# Patient Record
Sex: Female | Born: 1990 | Race: White | Hispanic: No | Smoking: Current every day smoker
Health system: Southern US, Community
[De-identification: ages and names within clinical notes are randomized; demographics above are authoritative.]

## PROBLEM LIST (undated history)

## (undated) ENCOUNTER — Inpatient Hospital Stay (HOSPITAL_COMMUNITY): Payer: Self-pay

## (undated) DIAGNOSIS — F32A Depression, unspecified: Secondary | ICD-10-CM

## (undated) DIAGNOSIS — B999 Unspecified infectious disease: Secondary | ICD-10-CM

## (undated) DIAGNOSIS — R000 Tachycardia, unspecified: Secondary | ICD-10-CM

## (undated) DIAGNOSIS — O139 Gestational [pregnancy-induced] hypertension without significant proteinuria, unspecified trimester: Secondary | ICD-10-CM

## (undated) DIAGNOSIS — M5136 Other intervertebral disc degeneration, lumbar region: Secondary | ICD-10-CM

## (undated) DIAGNOSIS — I219 Acute myocardial infarction, unspecified: Secondary | ICD-10-CM

## (undated) DIAGNOSIS — M51369 Other intervertebral disc degeneration, lumbar region without mention of lumbar back pain or lower extremity pain: Secondary | ICD-10-CM

## (undated) DIAGNOSIS — F419 Anxiety disorder, unspecified: Secondary | ICD-10-CM

## (undated) DIAGNOSIS — R42 Dizziness and giddiness: Secondary | ICD-10-CM

## (undated) DIAGNOSIS — T8859XA Other complications of anesthesia, initial encounter: Secondary | ICD-10-CM

## (undated) DIAGNOSIS — T4145XA Adverse effect of unspecified anesthetic, initial encounter: Secondary | ICD-10-CM

## (undated) DIAGNOSIS — F329 Major depressive disorder, single episode, unspecified: Secondary | ICD-10-CM

## (undated) HISTORY — PX: TEE WITHOUT CARDIOVERSION: SHX5443

## (undated) HISTORY — PX: MYRINGOTOMY: SUR874

## (undated) HISTORY — DX: Other intervertebral disc degeneration, lumbar region: M51.36

## (undated) HISTORY — PX: LEFT HEART CATH: SHX5946

## (undated) HISTORY — DX: Acute myocardial infarction, unspecified: I21.9

## (undated) HISTORY — DX: Dizziness and giddiness: R42

## (undated) HISTORY — PX: OTHER SURGICAL HISTORY: SHX169

## (undated) HISTORY — DX: Tachycardia, unspecified: R00.0

## (undated) HISTORY — PX: DILATION AND CURETTAGE OF UTERUS: SHX78

## (undated) HISTORY — DX: Other intervertebral disc degeneration, lumbar region without mention of lumbar back pain or lower extremity pain: M51.369

---
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
## 2012-01-05 ENCOUNTER — Other Ambulatory Visit (HOSPITAL_COMMUNITY): Payer: Self-pay | Admitting: Obstetrics and Gynecology

## 2012-01-05 DIAGNOSIS — O1400 Mild to moderate pre-eclampsia, unspecified trimester: Secondary | ICD-10-CM

## 2012-01-06 ENCOUNTER — Encounter (HOSPITAL_COMMUNITY): Payer: Self-pay | Admitting: *Deleted

## 2012-01-06 ENCOUNTER — Ambulatory Visit (HOSPITAL_COMMUNITY): Admission: RE | Admit: 2012-01-06 | Payer: Medicaid Other | Source: Ambulatory Visit

## 2012-01-06 ENCOUNTER — Inpatient Hospital Stay (HOSPITAL_COMMUNITY)
Admission: AD | Admit: 2012-01-06 | Discharge: 2012-01-06 | Disposition: A | Discharge status: Other Acute Inpatient Hospital | Payer: Medicaid Other | Source: Ambulatory Visit | Attending: Obstetrics & Gynecology | Admitting: Obstetrics & Gynecology

## 2012-01-06 ENCOUNTER — Ambulatory Visit (HOSPITAL_COMMUNITY): Admit: 2012-01-06 | Discharge: 2012-01-06 | Disposition: A | Discharge status: Home / Self Care | Payer: Medicaid Other

## 2012-01-06 ENCOUNTER — Ambulatory Visit (HOSPITAL_COMMUNITY)
Admission: RE | Admit: 2012-01-06 | Discharge: 2012-01-06 | Disposition: A | Discharge status: Home / Self Care | Payer: Medicaid Other | Source: Ambulatory Visit | Attending: Obstetrics and Gynecology | Admitting: Obstetrics and Gynecology

## 2012-01-06 ENCOUNTER — Other Ambulatory Visit (HOSPITAL_COMMUNITY): Payer: Self-pay | Admitting: Obstetrics and Gynecology

## 2012-01-06 ENCOUNTER — Encounter (HOSPITAL_COMMUNITY): Payer: Self-pay

## 2012-01-06 DIAGNOSIS — O10019 Pre-existing essential hypertension complicating pregnancy, unspecified trimester: Secondary | ICD-10-CM | POA: Insufficient documentation

## 2012-01-06 DIAGNOSIS — O139 Gestational [pregnancy-induced] hypertension without significant proteinuria, unspecified trimester: Secondary | ICD-10-CM | POA: Insufficient documentation

## 2012-01-06 DIAGNOSIS — O358XX0 Maternal care for other (suspected) fetal abnormality and damage, not applicable or unspecified: Secondary | ICD-10-CM | POA: Insufficient documentation

## 2012-01-06 DIAGNOSIS — O1400 Mild to moderate pre-eclampsia, unspecified trimester: Secondary | ICD-10-CM

## 2012-01-06 DIAGNOSIS — Z363 Encounter for antenatal screening for malformations: Secondary | ICD-10-CM | POA: Insufficient documentation

## 2012-01-06 DIAGNOSIS — Z1389 Encounter for screening for other disorder: Secondary | ICD-10-CM | POA: Insufficient documentation

## 2012-01-06 HISTORY — DX: Unspecified infectious disease: B99.9

## 2012-01-06 HISTORY — DX: Major depressive disorder, single episode, unspecified: F32.9

## 2012-01-06 HISTORY — DX: Anxiety disorder, unspecified: F41.9

## 2012-01-06 HISTORY — DX: Depression, unspecified: F32.A

## 2012-01-06 HISTORY — DX: Other complications of anesthesia, initial encounter: T88.59XA

## 2012-01-06 HISTORY — DX: Adverse effect of unspecified anesthetic, initial encounter: T41.45XA

## 2012-01-06 HISTORY — DX: Gestational (pregnancy-induced) hypertension without significant proteinuria, unspecified trimester: O13.9

## 2012-01-06 NOTE — Progress Notes (Signed)
Pt states she had been dizzy since 1am. Took BP at home and was elevated.  Came down after lying on side.

## 2012-01-06 NOTE — Progress Notes (Signed)
Alice Grant  was seen today for an ultrasound appointment.  See full report in [facility].  Alice Gula, MD  Alice Grant is seen for consultation today due to preeclampsia and absent end diastolic flow on clinic UA Dopplers.  She is a 21 yo G3P0020 currently at 27 2/7 weeks.  Her intake BP was 128/78 and she denies any history of chronic hypertension.  Approximately 1 week ago, she was started on Labetolol 100 mg BID which was recently increased to 200 mg BID.  A 24 hr urine protein on 10/14 showed 685 mg/24 hrs.  She has a strong family history for preecmapsia (Mother, Sister with E-clampsia at 24 weeks and subsequent delivery at 34 weeks with preeclampsia).  On ultrasound today, persisent absent end-diastolic flow is noted on UA Dopplers without reversed diastolic flow.  The fetus is active with a BPP of 8/8.  No gross fetal anomalies are noted.    Single IUP at 28 2/7 weeks Estimated fetal weight today is at the 24th %tile.  The [facility] measures at the 10th %tile No gross fetal anomalies noted.  Somewhat limited views of the anatomy were obtained due to fetal position. Absent end diastolic flow noted on UA Dopplers without reversed flow Normal amniotid fluid volume  Recommend admission for betamethasone/ observation for likely severe preeclampsia. NICU is currently closed at [facility] - plan transfer to [facility] for inpatient evaluation.

## 2012-01-06 NOTE — MAU Note (Signed)
Pt here for carelink transfer to [facility], per MFM.

## 2012-01-06 NOTE — MAU Note (Signed)
Called report to [facility] L&D charge nurse.  Called [facility] to transport pt.  [facility] informed that pt cannot be transferred from outpatient setting.  Called report to MAU charge nurse and made aware.  Pt taken to MAU and registered to finish transport. Charge RN notified of emtala documentation filled out by MD and RN in office.  Copy of records given.

## 2012-01-06 NOTE — MAU Note (Signed)
Call to [facility] by Humphrey Rolls that pt/[facility] is on the way.

## 2012-01-07 ENCOUNTER — Other Ambulatory Visit (HOSPITAL_COMMUNITY): Payer: Self-pay

## 2012-01-07 ENCOUNTER — Encounter (HOSPITAL_COMMUNITY): Payer: Medicaid Other

## 2012-11-10 ENCOUNTER — Encounter (HOSPITAL_COMMUNITY): Payer: Self-pay | Admitting: *Deleted

## 2014-01-22 ENCOUNTER — Encounter (HOSPITAL_COMMUNITY): Payer: Self-pay | Admitting: *Deleted

## 2015-04-04 ENCOUNTER — Encounter (HOSPITAL_COMMUNITY): Payer: Self-pay | Admitting: Emergency Medicine

## 2015-04-04 ENCOUNTER — Emergency Department (HOSPITAL_COMMUNITY): Payer: Medicaid Other

## 2015-04-04 ENCOUNTER — Emergency Department (HOSPITAL_COMMUNITY)
Admission: EM | Admit: 2015-04-04 | Discharge: 2015-04-05 | Discharge status: Against Medical Advice | Payer: Medicaid Other | Attending: Emergency Medicine | Admitting: Emergency Medicine

## 2015-04-04 DIAGNOSIS — R079 Chest pain, unspecified: Secondary | ICD-10-CM | POA: Insufficient documentation

## 2015-04-04 DIAGNOSIS — R200 Anesthesia of skin: Secondary | ICD-10-CM | POA: Insufficient documentation

## 2015-04-04 LAB — BASIC METABOLIC PANEL
Anion gap: 10 (ref 5–15)
BUN: 8 mg/dL (ref 6–20)
CHLORIDE: 110 mmol/L (ref 101–111)
CO2: 21 mmol/L — ABNORMAL LOW (ref 22–32)
CREATININE: 0.58 mg/dL (ref 0.44–1.00)
Calcium: 9 mg/dL (ref 8.9–10.3)
GFR calc Af Amer: >60 mL/min (ref >60)
GFR calc non Af Amer: >60 mL/min (ref >60)
GLUCOSE: 98 mg/dL (ref 65–99)
POTASSIUM: 3.8 mmol/L (ref 3.5–5.1)
Sodium: 141 mmol/L (ref 135–145)

## 2015-04-04 LAB — CBC
HEMATOCRIT: 38.8 % (ref 36.0–46.0)
Hemoglobin: 12.8 g/dL (ref 12.0–15.0)
MCH: 29.5 pg (ref 26.0–34.0)
MCHC: 33 g/dL (ref 30.0–36.0)
MCV: 89.4 fL (ref 78.0–100.0)
PLATELETS: 311 10*3/uL (ref 150–400)
RBC: 4.34 MIL/uL (ref 3.87–5.11)
RDW: 13.9 % (ref 11.5–15.5)
WBC: 7.8 10*3/uL (ref 4.0–10.5)

## 2015-04-04 LAB — I-STAT TROPONIN, ED: Troponin i, poc: 0 ng/mL (ref 0.00–0.08)

## 2015-04-04 NOTE — ED Notes (Signed)
No answer when pt's name called in the lobby 

## 2015-04-04 NOTE — ED Notes (Signed)
Pt called x2 with no response from lobby 

## 2015-04-04 NOTE — ED Notes (Signed)
Patient transported to X-ray 

## 2015-04-04 NOTE — ED Notes (Signed)
No answer when pt's name called in the waiting room 

## 2015-10-07 ENCOUNTER — Encounter (HOSPITAL_COMMUNITY): Payer: Self-pay

## 2015-10-07 ENCOUNTER — Emergency Department (HOSPITAL_COMMUNITY): Payer: Medicaid Other

## 2015-10-07 ENCOUNTER — Emergency Department (HOSPITAL_COMMUNITY)
Admission: EM | Admit: 2015-10-07 | Discharge: 2015-10-07 | Disposition: A | Discharge status: Home / Self Care | Payer: Medicaid Other | Attending: Emergency Medicine | Admitting: Emergency Medicine

## 2015-10-07 DIAGNOSIS — Z7982 Long term (current) use of aspirin: Secondary | ICD-10-CM | POA: Insufficient documentation

## 2015-10-07 DIAGNOSIS — Z87891 Personal history of nicotine dependence: Secondary | ICD-10-CM | POA: Insufficient documentation

## 2015-10-07 DIAGNOSIS — R079 Chest pain, unspecified: Secondary | ICD-10-CM | POA: Insufficient documentation

## 2015-10-07 LAB — BASIC METABOLIC PANEL
Anion gap: 5 (ref 5–15)
BUN: 8 mg/dL (ref 6–20)
CHLORIDE: 109 mmol/L (ref 101–111)
CO2: 23 mmol/L (ref 22–32)
CREATININE: 0.61 mg/dL (ref 0.44–1.00)
Calcium: 8.7 mg/dL — ABNORMAL LOW (ref 8.9–10.3)
GFR calc Af Amer: >60 mL/min (ref >60)
GFR calc non Af Amer: >60 mL/min (ref >60)
Glucose, Bld: 87 mg/dL (ref 65–99)
Potassium: 4 mmol/L (ref 3.5–5.1)
Sodium: 137 mmol/L (ref 135–145)

## 2015-10-07 LAB — CBC
HCT: 37.6 % (ref 36.0–46.0)
Hemoglobin: 12 g/dL (ref 12.0–15.0)
MCH: 28.6 pg (ref 26.0–34.0)
MCHC: 31.9 g/dL (ref 30.0–36.0)
MCV: 89.7 fL (ref 78.0–100.0)
PLATELETS: 276 10*3/uL (ref 150–400)
RBC: 4.19 MIL/uL (ref 3.87–5.11)
RDW: 13.6 % (ref 11.5–15.5)
WBC: 6.8 10*3/uL (ref 4.0–10.5)

## 2015-10-07 LAB — I-STAT TROPONIN, ED: Troponin i, poc: 0 ng/mL (ref 0.00–0.08)

## 2015-10-07 NOTE — ED Notes (Signed)
Per EMS - pt from home. Pt had episode of tightness in chest, tachypnea, tingling in fingers/toes this morning and called EMS. Hx anxiety, htn, mitral valve prolapse. Recently saw cardiologist d/t increasing number of episodes similar to today. Stress test and labs done Monday, no abnormal findings. Cardiologist believes symptoms to be r/t anxiety. PCP is hesitant to prescribe anxiety meds for unknown reason. During transport, pt anxiety decreased and rr 16. Pt resting comfortably in stretcher, NAD, rates pain 3/10 at this time. Pt took 324mg  aspirin prior to EMS arrival at home.

## 2015-10-07 NOTE — ED Notes (Signed)
MD at bedside. 

## 2015-10-07 NOTE — ED Notes (Signed)
Pt ambulated to restroom from room by Aldean JewettMillie, RN.

## 2015-10-07 NOTE — Discharge Instructions (Signed)
Nonspecific Chest Pain  °Chest pain can be caused by many different conditions. There is always a chance that your pain could be related to something serious, such as a heart attack or a blood clot in your lungs. Chest pain can also be caused by conditions that are not life-threatening. If you have chest pain, it is very important to follow up with your health care provider. °CAUSES  °Chest pain can be caused by: °· Heartburn. °· Pneumonia or bronchitis. °· Anxiety or stress. °· Inflammation around your heart (pericarditis) or lung (pleuritis or pleurisy). °· A blood clot in your lung. °· A collapsed lung (pneumothorax). It can develop suddenly on its own (spontaneous pneumothorax) or from trauma to the chest. °· Shingles infection (varicella-zoster virus). °· Heart attack. °· Damage to the bones, muscles, and cartilage that make up your chest wall. This can include: °¨ Bruised bones due to injury. °¨ Strained muscles or cartilage due to frequent or repeated coughing or overwork. °¨ Fracture to one or more ribs. °¨ Sore cartilage due to inflammation (costochondritis). °RISK FACTORS  °Risk factors for chest pain may include: °· Activities that increase your risk for trauma or injury to your chest. °· Respiratory infections or conditions that cause frequent coughing. °· Medical conditions or overeating that can cause heartburn. °· Heart disease or family history of heart disease. °· Conditions or health behaviors that increase your risk of developing a blood clot. °· Having had chicken pox (varicella zoster). °SIGNS AND SYMPTOMS °Chest pain can feel like: °· Burning or tingling on the surface of your chest or deep in your chest. °· Crushing, pressure, aching, or squeezing pain. °· Dull or sharp pain that is worse when you move, cough, or take a deep breath. °· Pain that is also felt in your back, neck, shoulder, or arm, or pain that spreads to any of these areas. °Your chest pain may come and go, or it may stay  constant. °DIAGNOSIS °Lab tests or other studies may be needed to find the cause of your pain. Your health care provider may have you take a test called an ambulatory ECG (electrocardiogram). An ECG records your heartbeat patterns at the time the test is performed. You may also have other tests, such as: °· Transthoracic echocardiogram (TTE). During echocardiography, sound waves are used to create a picture of all of the heart structures and to look at how blood flows through your heart. °· Transesophageal echocardiogram (TEE). This is a more advanced imaging test that obtains images from inside your body. It allows your health care provider to see your heart in finer detail. °· Cardiac monitoring. This allows your health care provider to monitor your heart rate and rhythm in real time. °· Holter monitor. This is a portable device that records your heartbeat and can help to diagnose abnormal heartbeats. It allows your health care provider to track your heart activity for several days, if needed. °· Stress tests. These can be done through exercise or by taking medicine that makes your heart beat more quickly. °· Blood tests. °· Imaging tests. °TREATMENT  °Your treatment depends on what is causing your chest pain. Treatment may include: °· Medicines. These may include: °¨ Acid blockers for heartburn. °¨ Anti-inflammatory medicine. °¨ Pain medicine for inflammatory conditions. °¨ Antibiotic medicine, if an infection is present. °¨ Medicines to dissolve blood clots. °¨ Medicines to treat coronary artery disease. °· Supportive care for conditions that do not require medicines. This may include: °¨ Resting. °¨ Applying heat   or cold packs to injured areas. °¨ Limiting activities until pain decreases. °HOME CARE INSTRUCTIONS °· If you were prescribed an antibiotic medicine, finish it all even if you start to feel better. °· Avoid any activities that bring on chest pain. °· Do not use any tobacco products, including  cigarettes, chewing tobacco, or electronic cigarettes. If you need help quitting, ask your health care provider. °· Do not drink alcohol. °· Take medicines only as directed by your health care provider. °· Keep all follow-up visits as directed by your health care provider. This is important. This includes any further testing if your chest pain does not go away. °· If heartburn is the cause for your chest pain, you may be told to keep your head raised (elevated) while sleeping. This reduces the chance that acid will go from your stomach into your esophagus. °· Make lifestyle changes as directed by your health care provider. These may include: °¨ Getting regular exercise. Ask your health care provider to suggest some activities that are safe for you. °¨ Eating a heart-healthy diet. A registered dietitian can help you to learn healthy eating options. °¨ Maintaining a healthy weight. °¨ Managing diabetes, if necessary. °¨ Reducing stress. °SEEK MEDICAL CARE IF: °· Your chest pain does not go away after treatment. °· You have a rash with blisters on your chest. °· You have a fever. °SEEK IMMEDIATE MEDICAL CARE IF:  °· Your chest pain is worse. °· You have an increasing cough, or you cough up blood. °· You have severe abdominal pain. °· You have severe weakness. °· You faint. °· You have chills. °· You have sudden, unexplained chest discomfort. °· You have sudden, unexplained discomfort in your arms, back, neck, or jaw. °· You have shortness of breath at any time. °· You suddenly start to sweat, or your skin gets clammy. °· You feel nauseous or you vomit. °· You suddenly feel light-headed or dizzy. °· Your heart begins to beat quickly, or it feels like it is skipping beats. °These symptoms may represent a serious problem that is an emergency. Do not wait to see if the symptoms will go away. Get medical help right away. Call your local emergency services (911 in the [place] Do not drive yourself to the hospital. °  °This  information is not intended to replace advice given to you by your health care provider. Make sure you discuss any questions you have with your health care provider. °  °Document Released: 12/17/2004 Document Revised: 03/30/2014 Document Reviewed: 10/13/2013 °Elsevier Interactive Patient Education ©2016 [employer] ° °

## 2015-10-07 NOTE — ED Notes (Signed)
Pt sdtates she understands instructions. Home with steady gairt.

## 2015-10-07 NOTE — ED Notes (Signed)
Patient transported to X-ray 

## 2015-10-07 NOTE — ED Provider Notes (Signed)
CSN: [id number]     Arrival date & time 10/07/15  0944 History   First MD Initiated Contact with Patient 10/07/15 1000     Chief Complaint  Patient presents with  . Chest Pain     (Consider location/radiation/quality/duration/timing/severity/associated sxs/prior Treatment) HPI Patient reports that she had a sudden episode of chest tightness and pain with difficulty breathing. The pain is first sharp and tight and then becomes dull and heavy. She reports it feels she can't catch her breath and her heart is racing. Patient has had similar episodes on multiple occasions. They often come on at rest. The patient reports that she was at work today. She has had extensive evaluation including a 30 day event monitor which she reports showed intermittent sinus tachycardia. She is also had a stress test and a cardiac echo. Patient reports she is taking metoprolol. She reports her dose was increased to 50 mg daily last week. There have been no other changes. These occur sporadically and without significant warning. He has not recently been sick or been suffering from fever cough or mucus production. No lower extremity swelling or pain. Past Medical History  Diagnosis Date  . Pregnancy induced hypertension   . Complication of anesthesia     epid went up not down  . Infection     urinary tract infection  . Depression     as teen  . Anxiety     panic attacks as teen   Past Surgical History  Procedure Laterality Date  . Myringotomy    . Dilation and curettage of uterus    . Bowel rotation      abd organs were reversed   Family History  Problem Relation Age of Onset  . Other Neg Hx   . COPD Mother   . Diabetes Mother   . Cancer Mother   . Heart disease Mother   . Hyperlipidemia Mother   . Hypertension Mother   . Lupus Mother   . Heart disease Maternal Grandmother   . Heart disease Maternal Grandfather    Social History  Substance Use Topics  . Smoking status: Former Smoker    Types:  Cigarettes  . Smokeless tobacco: Never Used     Comment: prior to preg  . Alcohol Use: No   OB History    Gravida Para Term Preterm AB TAB SAB Ectopic Multiple Living       Review of Systems 10 Systems reviewed and are negative for acute change except as noted in the HPI.    Allergies  Review of patient's allergies indicates no known allergies.  Home Medications   Prior to Admission medications   Medication Sig Start Date End Date Taking? Authorizing Provider  aspirin EC 81 MG tablet Take 81 mg by mouth daily.   Yes Historical Provider, MD  metoprolol succinate (TOPROL XL) 25 MG 24 hr tablet Take 25 mg of amoxicillin by mouth 2 (two) times daily. 09/30/15  Yes Historical Provider, MD  acetaminophen (TYLENOL) 500 MG tablet Take 1,000 mg by mouth once. For headache    Historical Provider, MD   BP 104/75 mmHg  Pulse 90  Temp(Src) 98.2 F (36.8 C) (Oral)  Resp 22  Ht  (1.626 m)  Wt 240 lb (108.863 kg)  BMI 41.18 kg/m2  SpO2 98%  LMP 09/23/2015 Physical Exam  Constitutional: She is oriented to person, place, and time. She appears well-developed and well-nourished.  HENT:  Head: Normocephalic and atraumatic.  Eyes: EOM are normal. Pupils are equal, round, and reactive to light.  Neck: Neck supple.  Cardiovascular: Normal rate, regular rhythm, normal heart sounds and intact distal pulses.   Pulmonary/Chest: Effort normal and breath sounds normal.  Abdominal: Soft. Bowel sounds are normal. She exhibits no distension. There is no tenderness.  Musculoskeletal: Normal range of motion. She exhibits no edema or tenderness.  Neurological: She is alert and oriented to person, place, and time. She has normal strength. No cranial nerve deficit. She exhibits normal muscle tone. Coordination normal. GCS eye subscore is 4. GCS verbal subscore is 5. GCS motor subscore is 6.  Skin: Skin is warm, dry and intact.  Psychiatric: She has a normal mood and affect.    ED  Course  Procedures (including critical care time) Labs Review Labs Reviewed  BASIC METABOLIC PANEL - Abnormal; Notable for the following:    Calcium 8.7 (*)    All other components within normal limits  CBC  I-STAT TROPOININ, ED    Imaging Review Dg Chest 2 View  10/07/2015  CLINICAL DATA:  Chest tightness with tachypnea EXAM: CHEST  2 VIEW COMPARISON:  Jul 31, 2015 FINDINGS: There is no edema or consolidation. The heart size and pulmonary vascularity are normal. No adenopathy. No pneumothorax. No bone lesions. IMPRESSION: No edema or consolidation. Electronically Signed   By: Bretta BangWilliam  Woodruff III M.D.   On: 10/07/2015 10:14   I have personally reviewed and evaluated these images and lab results as part of my medical decision-making.   EKG Interpretation   Date/Time:  Monday October 07 2015 09:49:34 EDT Ventricular Rate:  96 PR Interval:    QRS Duration: 86 QT Interval:  355 QTC Calculation: 449 R Axis:   52 Text Interpretation:  Sinus rhythm Low voltage, precordial leads normal.  no change Confirmed by Donnald GarrePfeiffer, MD, Lebron ConnersMarcy [phone]([id number]) on 10/07/2015 10:43:07  AM      MDM   Final diagnoses:  Chest pain, unspecified chest pain type   Patient has had extensive workup for similar presentations. At this time there appears to be low risk for cardiac ischemic or structural cardiac disease. The patient is clinically well in appearance. Physical examination is normal. At this time she is given precautionary statements to return and advised to follow-up with her family doctor for recheck as soon as possible.    Arby BarretteMarcy Abreanna Drawdy, MD 10/07/15 1115

## 2016-09-25 ENCOUNTER — Encounter: Payer: Self-pay | Admitting: Neurology

## 2016-11-02 ENCOUNTER — Encounter: Payer: Self-pay | Admitting: Neurology

## 2016-11-18 ENCOUNTER — Encounter: Payer: Self-pay | Admitting: Neurology

## 2016-11-18 ENCOUNTER — Ambulatory Visit (INDEPENDENT_AMBULATORY_CARE_PROVIDER_SITE_OTHER): Payer: Medicaid Other | Admitting: Neurology

## 2016-11-18 VITALS — BP 112/68 | HR 116 | Ht 64.0 in | Wt 228.0 lb

## 2016-11-18 DIAGNOSIS — G43109 Migraine with aura, not intractable, without status migrainosus: Secondary | ICD-10-CM

## 2016-11-18 DIAGNOSIS — R55 Syncope and collapse: Secondary | ICD-10-CM | POA: Diagnosis not present

## 2016-11-18 MED ORDER — ZOLMITRIPTAN 5 MG PO TABS: ORAL_TABLET | ORAL | 6 refills | Status: DC

## 2016-11-18 MED ORDER — METOPROLOL SUCCINATE ER 25 MG PO TB24: ORAL_TABLET | ORAL | 6 refills | Status: DC

## 2016-11-18 NOTE — Progress Notes (Signed)
NEUROLOGY CONSULTATION NOTE  Alice Grant MRN: [id number] DOB: 06-02-1990  Referring provider: Dr. Abner Greenspan Primary care provider: Dr. Abner Greenspan  Reason for consult:  syncope  Dear Dr Yetta Flock:  Thank you for your kind referral of Alice Grant for consultation of the above symptoms. Although her history is well known to you, please allow me to reiterate it for the purpose of our medical record. Records and images were personally reviewed where available.  HISTORY OF PRESENT ILLNESS: This is a 26 year old right-handed woman presenting for evaluation of recurrent syncope and migraines. She has her 27 year old and 27 year old in the office today. She reports the first syncopal episode occurred 2-3 years ago, she would start having chest pain, feel dizzy, vision is blurred, then her left arm goes numb with tingling in her fingers. She was in the hospital in [place] in October 2017 for syncope, she had the chest discomfort and then passed out, waking up to her son jumping on her chest and her daughter trying to pull her eyes open. She checked her BP, which was normal, then went to the ER. She was noted to have a heart rate of 118, BP 151/77. Exam normal, head CT without contrast normal. EKG reported sinus tachycardia with occasional PVCs. She was back to the ER at [facility] last 07/16/16 reporting chest pain, palpitations, syncope, and headache. She has been seeing Cardiology and takes Metoprolol. She was at [facility] and started feeling blurry, a friend drove her home. On the way home, she became unresponsive. She reported that she did not seek medical care because she has been seen numerous times at [facility] for similar episodes and "always told it is stress." She states the last time she passed out 1.5 months ago, her husband told her she started having jerking and shaking. They were at [facility] and she fell down the bleachers, hitting her head and hip. No tongue bite or  incontinence. She reports the longest she is out would be 5 minutes. She has episodes where she feels like she will pass out, she lies down and the sensation goes away after a few hours. Some days she feels the symptoms for 10-15 minutes then passes out, other times she feels bad the whole day. She has been woken up from sleep by chest pain and shortness of breath occurring a couple of times a week. She feels confused for several hours after a syncopal episode. She reports frequency has significantly reduced, she was having them 3-4 times a week, every other day initially, but since she started smoking marijuana every night, she can go a couple of weeks without any events. She denies any clear triggers, she only sleeps 4 hours, and has not been sleeping well the past 2-3 years.   She reportedly had a negative stress test, echo showed normal EF with mild VHD. Event monitor showed frequent episodes of inappropriate tachycardia. She reports compliance to Toprol 25mg  BID with no side effects. She also reported Zoloft dose was increased for anxiety. She wanted to be evaluated for migraine, stating headaches start after the syncopal episodes. Ibuprofen and Imitrex are ineffective. She had been instructed to decrease caffeine intake, she states she now only drinks water, maybe she would have one soda but does not do this daily. She started having headaches a year ago, with sharp stabbing pain over the left frontal region. She would be sick to her stomach and has vomited a few times. Pain  can last a couple of days, she would usually stay in bed, with photosensitivity. Sometimes she sees white lights. She has occasional neck pain, back hurts all the time. She denies any diplopia, dysarthria/dysphagia, bowel/bladder dysfunction. She has no pregnancy plans. She denies any staring/unresponsive episodes, olfactory/gustatory hallucinations, deja vu, rising epigastric sensation, focal numbness/tingling/weakness, myoclonic jerks.  She had a normal birth and early development.  There is no history of febrile convulsions, CNS infections such as meningitis/encephalitis, significant traumatic brain injury, neurosurgical procedures. Her mother used to pass out. Her brother had seizures and died from a seizure at age 98. Her mother had a history of seizures.   PAST MEDICAL HISTORY: Past Medical History:  Diagnosis Date  . Anxiety    panic attacks as teen  . Complication of anesthesia    epid went up not down  . Degenerative disc disease, lumbar   . Depression    as teen  . Infection    urinary tract infection  . Pregnancy induced hypertension   . Tachycardia   . Vertigo     PAST SURGICAL HISTORY: Past Surgical History:  Procedure Laterality Date  . bowel rotation     abd organs were reversed  . DILATION AND CURETTAGE OF UTERUS    . MYRINGOTOMY      MEDICATIONS: Current Outpatient Prescriptions on File Prior to Visit  Medication Sig Dispense Refill  . acetaminophen (TYLENOL) 500 MG tablet Take 1,000 mg by mouth once. For headache    . aspirin EC 81 MG tablet Take 81 mg by mouth daily.    . metoprolol succinate (TOPROL XL) 25 MG 24 hr tablet Take 25 mg of amoxicillin by mouth 2 (two) times daily.     No current facility-administered medications on file prior to visit.     ALLERGIES: No Known Allergies  FAMILY HISTORY: Family History  Problem Relation Age of Onset  . COPD Mother   . Diabetes Mother   . Cancer Mother   . Heart disease Mother   . Hyperlipidemia Mother   . Hypertension Mother   . Lupus Mother   . Heart disease Maternal Grandmother   . Heart disease Maternal Grandfather   . Other Neg Hx     SOCIAL HISTORY: Social History   Social History  . Marital status: Married    Spouse name: N/A  . Number of children: N/A  . Years of education: N/A   Occupational History  . Not on file.   Social History Main Topics  . Smoking status: Former Smoker    Types: Cigarettes  . Smokeless  tobacco: Never Used     Comment: prior to preg  . Alcohol use No  . Drug use: No  . Sexual activity: Not Currently   Other Topics Concern  . Not on file   Social History Narrative  . No narrative on file    REVIEW OF SYSTEMS: Constitutional: No fevers, chills, or sweats, no generalized fatigue, change in appetite Eyes: No visual changes, double vision, eye pain Ear, nose and throat: No hearing loss, ear pain, nasal congestion, sore throat Cardiovascular: No chest pain, palpitations Respiratory:  No shortness of breath at rest or with exertion, wheezes GastrointestinaI: No nausea, vomiting, diarrhea, abdominal pain, fecal incontinence Genitourinary:  No dysuria, urinary retention or frequency Musculoskeletal:  + neck pain, back pain Integumentary: No rash, pruritus, skin lesions Neurological: as above Psychiatric: + depression, insomnia, anxiety Endocrine: No palpitations, fatigue, diaphoresis, mood swings, change in appetite, change in weight, increased  thirst Hematologic/Lymphatic:  No anemia, purpura, petechiae. Allergic/Immunologic: no itchy/runny eyes, nasal congestion, recent allergic reactions, rashes  PHYSICAL EXAM: Vitals:   11/18/16 1354  BP: 112/68  Pulse: (!) 116  SpO2: 99%   General: No acute distress Head:  Normocephalic/atraumatic Eyes: Fundoscopic exam shows bilateral sharp discs, no vessel changes, exudates, or hemorrhages Neck: supple, no paraspinal tenderness, full range of motion Back: No paraspinal tenderness Heart: regular rate and rhythm Lungs: Clear to auscultation bilaterally. Vascular: No carotid bruits. Skin/Extremities: No rash, no edema Neurological Exam: Mental status: alert and oriented to person, place, and time, no dysarthria or aphasia, Fund of knowledge is appropriate.  Recent and remote memory are intact.  Attention and concentration are normal.    Able to name objects and repeat phrases. Cranial nerves: CN I: not tested CN II:  pupils equal, round and reactive to light, visual fields intact, fundi unremarkable. CN III, IV, VI:  full range of motion, no nystagmus, no ptosis CN V: facial sensation intact CN VII: upper and lower face symmetric CN VIII: hearing intact to finger rub CN IX, X: gag intact, uvula midline CN XI: sternocleidomastoid and trapezius muscles intact CN XII: tongue midline Bulk & Tone: normal, no fasciculations. Motor: 5/5 throughout with no pronator drift. Sensation: intact to light touch, cold, pin, vibration and joint position sense.  No extinction to double simultaneous stimulation.  Romberg test negative Deep Tendon Reflexes: +2 throughout, no ankle clonus Plantar responses: downgoing bilaterally Cerebellar: no incoordination on finger to nose, heel to shin. No dysdiadochokinesia Gait: narrow-based and steady, able to tandem walk adequately. Tremor: none  IMPRESSION: This is a 26 year old right-handed woman with a history of tachycardia, anxiety, presenting for recurrent syncope and migraines. Her husband reported jerking and shaking with one of these episodes. Symptoms suggestive of syncope/convulsive syncope, however with family history of seizures and report of prolonged confusion after the events, seizures are also a consideration. MRI brain with and without contrast and 1-hour EEG will be ordered to assess for focal abnormalities that increase risk for recurrent seizures. Headaches are suggestive of migraines, she takes metoprolol for tachycardia, this can help for migraine prophylaxis as well, increase to 25mg  in AM, 50mg  in PM. She was given a prescription of Zomig prn to take at the onset of migraine. Side effects were discussed. She will discuss back pain with Ortho.  [place] driving laws were discussed with the patient, and she knows to stop driving after an episode of loss of consciousness, until 6 months event-free. She will follow-up in 3-4 months and knows to call for any changes.  Thank  you for allowing me to participate in the care of this patient. Please do not hesitate to call for any questions or concerns.   Patrcia Dolly, M.D.  CC: Dr. Yetta Flock

## 2016-11-18 NOTE — Patient Instructions (Addendum)
1. Schedule MRI brain with and without contrast  We have [facility] a referral [facility] for your [facility] and they will call you directly to schedule your appt. They are located at [address] If you need to contact them directly please call [phone].  2. Schedule 1-hour EEG  EEG's are preformed in our office by our EEG technician, Darl PikesSusan.    3. Increase Metoprolol 25mg : Take 1 tablet in AM, 2 tablets in PM 4. Take Zomig 5mg : take 1 tablet at onset of headache, do not take more than 2-3 a week 5. As per [place] driving laws, no driving after an episode of loss of consciousness, until 6 months event-free 6. Follow-up with Ortho for back pain 7. Follow-up in 3-4 months, call for any changes

## 2016-11-24 ENCOUNTER — Encounter: Payer: Self-pay | Admitting: Neurology

## 2016-11-24 DIAGNOSIS — R55 Syncope and collapse: Secondary | ICD-10-CM | POA: Insufficient documentation

## 2016-11-24 DIAGNOSIS — G43109 Migraine with aura, not intractable, without status migrainosus: Secondary | ICD-10-CM | POA: Insufficient documentation

## 2016-11-30 ENCOUNTER — Ambulatory Visit (INDEPENDENT_AMBULATORY_CARE_PROVIDER_SITE_OTHER): Payer: Medicaid Other | Admitting: Neurology

## 2016-11-30 DIAGNOSIS — R55 Syncope and collapse: Secondary | ICD-10-CM | POA: Diagnosis not present

## 2016-11-30 DIAGNOSIS — G43109 Migraine with aura, not intractable, without status migrainosus: Secondary | ICD-10-CM

## 2016-12-01 ENCOUNTER — Telehealth: Payer: Self-pay

## 2016-12-01 NOTE — Telephone Encounter (Signed)
-----   Message from Van ClinesKaren M Aquino, MD sent at 12/01/2016 10:59 AM EDT ----- Pls let her know the EEG was normal. Thanks

## 2016-12-01 NOTE — Procedures (Signed)
ELECTROENCEPHALOGRAM REPORT  Date of Study: 11/30/2016  Patient's Name: Alice Grant MRN: [id number] Date of Birth: Aug 13, 1990  Referring Provider: Dr. Patrcia DollyKaren Aquino  Clinical History: This is a 26 year old woman with recurrent episodes of loss of consciousness, one with jerking/shaking and prolonged post-event confusion.   Medications: Tylenol, aspirin, Toprol  Technical Summary: A multichannel digital 1-hour EEG recording measured by the international 10-20 system with electrodes applied with paste and impedances below 5000 ohms performed in our laboratory with EKG monitoring in an awake and drowsy patient.  Hyperventilation and photic stimulation were performed.  The digital EEG was referentially recorded, reformatted, and digitally filtered in a variety of bipolar and referential montages for optimal display.    Description: The patient is awake and drowsy during the recording.  During maximal wakefulness, there is a symmetric, medium voltage 10-11 Hz posterior dominant rhythm that attenuates with eye opening.  The record is symmetric.  During drowsiness, there is an increase in theta slowing of the background.  Deeper stages of sleep were not seen.  Hyperventilation and photic stimulation did not elicit any abnormalities.  There were no epileptiform discharges or electrographic seizures seen.    EKG lead was unremarkable.  Impression: This 1-hour awake and drowsy EEG is normal.    Clinical Correlation: A normal EEG does not exclude a clinical diagnosis of epilepsy.  If further clinical questions remain, prolonged EEG may be helpful.  Clinical correlation is advised.   Patrcia DollyKaren Aquino, M.D.

## 2016-12-01 NOTE — Telephone Encounter (Signed)
Spoke with pt, relaying message below. 

## 2016-12-05 ENCOUNTER — Other Ambulatory Visit: Payer: Medicaid Other

## 2016-12-19 ENCOUNTER — Ambulatory Visit
Admission: RE | Admit: 2016-12-19 | Discharge: 2016-12-19 | Disposition: A | Discharge status: Home / Self Care | Payer: Medicaid Other | Source: Ambulatory Visit | Attending: Neurology | Admitting: Neurology

## 2016-12-19 DIAGNOSIS — G43109 Migraine with aura, not intractable, without status migrainosus: Secondary | ICD-10-CM

## 2016-12-19 DIAGNOSIS — R55 Syncope and collapse: Secondary | ICD-10-CM

## 2016-12-19 MED ORDER — GADOBENATE DIMEGLUMINE 529 MG/ML IV SOLN
20.0000 mL | Freq: Once | INTRAVENOUS | Status: AC | PRN
  Administered 2016-12-19: 20 mL via INTRAVENOUS

## 2016-12-23 ENCOUNTER — Telehealth: Payer: Self-pay

## 2016-12-23 NOTE — Telephone Encounter (Signed)
Spoke with pt relaying message below.   

## 2016-12-23 NOTE — Telephone Encounter (Signed)
-----   Message from Van Clines, MD sent at 12/21/2016  9:34 AM EDT ----- Pls let her know MRI brain is normal, no evidence of tumor, stroke, or bleed. Thanks

## 2017-03-29 ENCOUNTER — Ambulatory Visit: Payer: Medicaid Other | Admitting: Neurology

## 2021-05-09 ENCOUNTER — Emergency Department (HOSPITAL_BASED_OUTPATIENT_CLINIC_OR_DEPARTMENT_OTHER): Payer: Self-pay | Admitting: Radiology

## 2021-05-09 ENCOUNTER — Emergency Department (HOSPITAL_BASED_OUTPATIENT_CLINIC_OR_DEPARTMENT_OTHER)
Admission: EM | Admit: 2021-05-09 | Discharge: 2021-05-09 | Disposition: A | Discharge status: Home / Self Care | Payer: Self-pay | Attending: Emergency Medicine | Admitting: Emergency Medicine

## 2021-05-09 ENCOUNTER — Encounter (HOSPITAL_BASED_OUTPATIENT_CLINIC_OR_DEPARTMENT_OTHER): Payer: Self-pay | Admitting: *Deleted

## 2021-05-09 ENCOUNTER — Other Ambulatory Visit: Payer: Self-pay

## 2021-05-09 DIAGNOSIS — S53401A Unspecified sprain of right elbow, initial encounter: Secondary | ICD-10-CM | POA: Insufficient documentation

## 2021-05-09 DIAGNOSIS — W19XXXA Unspecified fall, initial encounter: Secondary | ICD-10-CM

## 2021-05-09 DIAGNOSIS — Z7982 Long term (current) use of aspirin: Secondary | ICD-10-CM | POA: Insufficient documentation

## 2021-05-09 DIAGNOSIS — W1830XA Fall on same level, unspecified, initial encounter: Secondary | ICD-10-CM | POA: Insufficient documentation

## 2021-05-09 MED ORDER — HYDROCODONE-ACETAMINOPHEN 5-325 MG PO TABS
1.0000 | ORAL_TABLET | Freq: Once | ORAL | Status: AC
  Administered 2021-05-09: 1 via ORAL
  Filled 2021-05-09: qty 1

## 2021-05-09 MED ORDER — LIDOCAINE 5 % EX PTCH
1.0000 | MEDICATED_PATCH | CUTANEOUS | Status: DC
  Administered 2021-05-09: 1 via TRANSDERMAL
  Filled 2021-05-09: qty 1

## 2021-05-09 MED ORDER — METHOCARBAMOL 500 MG PO TABS: 1000.0000 mg | ORAL_TABLET | Freq: Two times a day (BID) | ORAL | 0 refills | Status: AC

## 2021-05-09 MED ORDER — OXYCODONE HCL 5 MG PO TABS: 5.0000 mg | ORAL_TABLET | Freq: Four times a day (QID) | ORAL | 0 refills | Status: DC | PRN

## 2021-05-09 NOTE — ED Provider Notes (Signed)
[facility] EMERGENCY DEPT Provider Note   CSN: [id number] Arrival date & time: 05/09/21  0859     History  Chief Complaint  Patient presents with   Marletta Lor    Alice Grant is a 31 y.o. female.  This is a 31 y.o. female with significant medical history as below, including anxiety, depression, vertigo who presents to the ED with complaint of fall, right-sided pain.  Patient was yesterday afternoon she was ambulating, not paying attention and fell to the ground.  Fell onto her right side.  No head injury, no LOC, no thinners.  She was able to ambulate afterwards.  Right-handed.  Pain to right shoulder, right elbow, right wrist, right hip, right knee.  Mild swelling noted to affected extremities.  She also has some mild paraspinal pain to the lumbar on the right.  No incontinence, no numbness, she is ambulating appropriately.  No headache.  No nausea or vomiting.  No saddle paresthesias.  No thinners    Past Medical History: No date: Anxiety     Comment:  panic attacks as teen No date: Complication of anesthesia     Comment:  epid went up not down No date: Degenerative disc disease, lumbar No date: Depression     Comment:  as teen No date: Infection     Comment:  urinary tract infection No date: Pregnancy induced hypertension No date: Tachycardia No date: Vertigo  Past Surgical History: No date: bowel rotation     Comment:  abd organs were reversed No date: DILATION AND CURETTAGE OF UTERUS No date: MYRINGOTOMY    The history is provided by the patient and the spouse.  Fall Pertinent negatives include no chest pain, no abdominal pain, no headaches and no shortness of breath.      Home Medications Prior to Admission medications   Medication Sig Start Date End Date Taking? Authorizing Provider  methocarbamol (ROBAXIN) 500 MG tablet Take 2 tablets (1,000 mg total) by mouth 2 (two) times daily for 5 days. 05/09/21 05/14/21 Yes Tanda Rockers A, DO  oxyCODONE  (ROXICODONE) 5 MG immediate release tablet Take 1 tablet (5 mg total) by mouth every 6 (six) hours as needed for up to 5 doses for severe pain or breakthrough pain. 05/09/21  Yes Tanda Rockers A, DO  acetaminophen (TYLENOL) 500 MG tablet Take 1,000 mg by mouth once. For headache    [provider]  aspirin EC 81 MG tablet Take 81 mg by mouth daily.    [provider]  metoprolol succinate (TOPROL XL) 25 MG 24 hr tablet Take 1 tablet in AM, 2 tablets in PM 11/18/16   Van Clines, MD  sertraline (ZOLOFT) 100 MG tablet Take 100 mg by mouth.    [provider]  SUMAtriptan (IMITREX) 50 MG tablet Take 50 mg by mouth every 2 (two) hours as needed for migraine. May repeat in 2 hours if headache persists or recurs.    [provider]  zolmitriptan (ZOMIG) 5 MG tablet Take 1 tablet at onset of migraine, do not take more than 2-3 a week 11/18/16   Van Clines, MD      Allergies    Patient has no known allergies.    Review of Systems   Review of Systems  Constitutional:  Negative for activity change and fever.  HENT:  Negative for facial swelling and trouble swallowing.   Eyes:  Negative for discharge and redness.  Respiratory:  Negative for cough and shortness of breath.  Cardiovascular:  Negative for chest pain and palpitations.  Gastrointestinal:  Negative for abdominal pain and nausea.  Genitourinary:  Negative for dysuria and flank pain.  Musculoskeletal:  Positive for arthralgias and back pain. Negative for gait problem.  Skin:  Negative for pallor and rash.  Neurological:  Negative for syncope and headaches.   Physical Exam Updated Vital Signs BP (!) 141/84 (BP Location: Left Arm)    Pulse 76    Temp 98.4 F (36.9 C) (Oral)    Resp 16    Ht 5\' 4"  (1.626 m)    Wt 113.4 kg    LMP 04/25/2021    SpO2 100%    BMI 42.91 kg/m  Physical Exam Vitals and nursing note reviewed.  Constitutional:      General: She is not in acute distress.    Appearance:  Normal appearance. She is obese. She is not ill-appearing, toxic-appearing or diaphoretic.  HENT:     Head: Normocephalic and atraumatic.     Right Ear: External ear normal.     Left Ear: External ear normal.     Nose: Nose normal.     Mouth/Throat:     Mouth: Mucous membranes are moist.  Eyes:     General: No scleral icterus.       Right eye: No discharge.        Left eye: No discharge.  Cardiovascular:     Rate and Rhythm: Normal rate and regular rhythm.     Pulses: Normal pulses.     Heart sounds: Normal heart sounds.  Pulmonary:     Effort: Pulmonary effort is normal. No respiratory distress.     Breath sounds: Normal breath sounds.  Abdominal:     General: Abdomen is flat.     Tenderness: There is no abdominal tenderness.  Musculoskeletal:        General: Normal range of motion.       Arms:     Cervical back: Normal range of motion.     Right lower leg: No edema.     Left lower leg: No edema.     Comments: Right upper extremity is neurovascularly intact to radial, ulnar and median nerve distributions.  2+ radial pulse.  Limited range of motion to right elbow secondary to discomfort.  Mild paraspinal lumbar muscle tenderness on the right.  Mild tenderness to trapezius muscle on the right.  Mild pain to right knee on palpation to lateral bursa.  No laxity to Lachman's or anterior drawer.  No discomfort to varus or valgus testing of the knee.  2+ DP pulse equal bilateral.  No discomfort on palpation of the b/l hip.  No midline spinous process tenderness to palpation or percussion, no crepitus or step-off to midline of back.   Skin:    General: Skin is warm and dry.     Capillary Refill: Capillary refill takes less than 2 seconds.  Neurological:     Mental Status: She is alert.  Psychiatric:        Mood and Affect: Mood normal.        Behavior: Behavior normal.    ED Results / Procedures / Treatments   Labs (all labs ordered are listed, but only abnormal results are  displayed) Labs Reviewed - No data to display  EKG None  Radiology DG Shoulder Right  Result Date: 05/09/2021 CLINICAL DATA:  Fall. EXAM: RIGHT SHOULDER - 2+ VIEW COMPARISON:  None. FINDINGS: Normal bone mineralization. Joint spaces are preserved. No acute fracture  or dislocation. The visualized portion of the right lung is unremarkable. IMPRESSION: Normal right shoulder radiographs. Electronically Signed   By: Neita Garnet M.D.   On: 05/09/2021 10:16   DG Elbow Complete Right  Result Date: 05/09/2021 CLINICAL DATA:  Fall.  Cannot bend. EXAM: RIGHT ELBOW - COMPLETE 3+ VIEW COMPARISON:  None. FINDINGS: Normal bone mineralization. Joint spaces are preserved. No acute fracture is seen. No dislocation. Incidental note of a tiny supracondylar spur within the distal anterior humeral diaphyseal cortex mildly extending towards the elbow joint. IMPRESSION: No acute fracture. Normal variant "supracondylar spur" within the mid to distal humerus. Electronically Signed   By: Neita Garnet M.D.   On: 05/09/2021 10:21   DG Wrist Complete Right  Result Date: 05/09/2021 CLINICAL DATA:  Fall. EXAM: RIGHT WRIST - COMPLETE 3+ VIEW COMPARISON:  11/26/2014 FINDINGS: There is again 2 mm ulnar negative variance. Normal bone mineralization. Joint spaces are preserved. No acute fracture is seen. No dislocation. IMPRESSION: Normal right wrist radiographs. Electronically Signed   By: Neita Garnet M.D.   On: 05/09/2021 10:24   DG Knee Complete 4 Views Right  Result Date: 05/09/2021 CLINICAL DATA:  Fall.  Cannot bend. EXAM: RIGHT KNEE - COMPLETE 4+ VIEW COMPARISON:  None. FINDINGS: Normal bone mineralization. Joint spaces are preserved. No joint effusion. No acute fracture or dislocation. IMPRESSION: Normal right knee radiographs. Electronically Signed   By: Neita Garnet M.D.   On: 05/09/2021 10:17   DG Hip Unilat W or Wo Pelvis 2-3 Views Right  Result Date: 05/09/2021 CLINICAL DATA:  Fall yesterday.  Pain. EXAM: DG  HIP (WITH OR WITHOUT PELVIS) 2-3V RIGHT COMPARISON:  None. FINDINGS: Normal bone mineralization. Joint spaces are preserved. No acute fracture is seen. No dislocation. IMPRESSION: Normal pelvis and right hip radiographs. Electronically Signed   By: Neita Garnet M.D.   On: 05/09/2021 10:22    Procedures Procedures    Medications Ordered in ED Medications  lidocaine (LIDODERM) 5 % 1 patch (1 patch Transdermal Patch Applied 05/09/21 1028)  HYDROcodone-acetaminophen (NORCO/VICODIN) 5-325 MG per tablet 1 tablet (1 tablet Oral Given 05/09/21 1027)    ED Course/ Medical Decision Making/ A&P                           Medical Decision Making Amount and/or Complexity of Data Reviewed Independent Historian: spouse External Data Reviewed: labs, radiology and notes. Radiology: ordered. Decision-making details documented in ED Course.  Risk Prescription drug management.    CC: Fall, arm, back, knee pain  This patient presents to the Emergency Department for the above complaint. This involves an extensive number of treatment options and is a complaint that carries with it a high risk of complications and morbidity. Vital signs were reviewed. Serious etiologies considered.  Record review:  Previous records obtained and reviewed   Additional history obtained from spouse  Medical and surgical history as noted above.   Work up as above, notable for:    imaging results that were available during my care of the patient were reviewed by me and considered in my medical decision making.   I ordered imaging studies which included shoulder, elbow, wrist, hip, pelvis, knee x-ray and I reviewed imaging which showed no acute fracture.   Social determinants of health include - N/a  Management: Oral analgesic, lidoderm  Reassessment:  Patient does report her pain is improved. Likely MSK injury 2/2 fall, low susp for fx. No head injury, no  loc.   Discussed supportive care at home, RICE protocol,  sling use.  No heavy lifting for 3 days.  Follow-up with PCP.  Multimodal pain control  The patient improved significantly and was discharged in stable condition. Detailed discussions were had with the patient regarding current findings, and need for close f/u with PCP or on call doctor. The patient has been instructed to return immediately if the symptoms worsen in any way for re-evaluation. Patient verbalized understanding and is in agreement with current care plan. All questions answered prior to discharge.         This chart was dictated using voice recognition software.  Despite best efforts to proofread,  errors can occur which can change the documentation meaning.         Final Clinical Impression(s) / ED Diagnoses Final diagnoses:  Fall, initial encounter  Sprain of right elbow, initial encounter    Rx / DC Orders ED Discharge Orders          Ordered    methocarbamol (ROBAXIN) 500 MG tablet  2 times daily        05/09/21 1128    oxyCODONE (ROXICODONE) 5 MG immediate release tablet  Every 6 hours PRN        05/09/21 1128              Sloan LeiterGray, Yarden Manuelito A, DO 05/09/21 1129

## 2021-05-09 NOTE — ED Triage Notes (Signed)
Pt tripped and fell yesterday and has pain and swelling to right arm and states that she can not bend right elbow.  Pt also reports right knee pain. Pt ambulatory in ED

## 2021-05-09 NOTE — Discharge Instructions (Addendum)
It was a pleasure caring for you today in the emergency department.  Please take Motrin or Tylenol every 4-6 hours as needed for discomfort per manufacturer's instructions on box.   Please return to the emergency department for any worsening or worrisome symptoms.

## 2022-08-21 DIAGNOSIS — O209 Hemorrhage in early pregnancy, unspecified: Secondary | ICD-10-CM | POA: Diagnosis not present

## 2022-08-21 DIAGNOSIS — Z3689 Encounter for other specified antenatal screening: Secondary | ICD-10-CM | POA: Diagnosis not present

## 2022-09-07 DIAGNOSIS — Z3A09 9 weeks gestation of pregnancy: Secondary | ICD-10-CM | POA: Diagnosis not present

## 2022-09-07 DIAGNOSIS — Z369 Encounter for antenatal screening, unspecified: Secondary | ICD-10-CM | POA: Diagnosis not present

## 2022-09-07 DIAGNOSIS — O09299 Supervision of pregnancy with other poor reproductive or obstetric history, unspecified trimester: Secondary | ICD-10-CM | POA: Diagnosis not present

## 2022-09-07 DIAGNOSIS — O3680X0 Pregnancy with inconclusive fetal viability, not applicable or unspecified: Secondary | ICD-10-CM | POA: Diagnosis not present

## 2022-10-07 DIAGNOSIS — O368390 Maternal care for abnormalities of the fetal heart rate or rhythm, unspecified trimester, not applicable or unspecified: Secondary | ICD-10-CM | POA: Diagnosis not present

## 2022-11-05 DIAGNOSIS — O0992 Supervision of high risk pregnancy, unspecified, second trimester: Secondary | ICD-10-CM | POA: Diagnosis not present

## 2022-11-05 DIAGNOSIS — Z3A17 17 weeks gestation of pregnancy: Secondary | ICD-10-CM | POA: Diagnosis not present

## 2022-12-01 DIAGNOSIS — Z3A21 21 weeks gestation of pregnancy: Secondary | ICD-10-CM | POA: Diagnosis not present

## 2022-12-01 DIAGNOSIS — O2342 Unspecified infection of urinary tract in pregnancy, second trimester: Secondary | ICD-10-CM | POA: Diagnosis not present

## 2022-12-01 DIAGNOSIS — O26892 Other specified pregnancy related conditions, second trimester: Secondary | ICD-10-CM | POA: Diagnosis not present

## 2022-12-01 DIAGNOSIS — R102 Pelvic and perineal pain: Secondary | ICD-10-CM | POA: Diagnosis not present

## 2023-01-20 DIAGNOSIS — Z3A28 28 weeks gestation of pregnancy: Secondary | ICD-10-CM | POA: Diagnosis not present

## 2023-01-20 DIAGNOSIS — O99212 Obesity complicating pregnancy, second trimester: Secondary | ICD-10-CM | POA: Diagnosis not present

## 2023-02-26 DIAGNOSIS — Z3A34 34 weeks gestation of pregnancy: Secondary | ICD-10-CM | POA: Diagnosis not present

## 2023-02-26 DIAGNOSIS — O99213 Obesity complicating pregnancy, third trimester: Secondary | ICD-10-CM | POA: Diagnosis not present

## 2023-03-05 DIAGNOSIS — Z3685 Encounter for antenatal screening for Streptococcus B: Secondary | ICD-10-CM | POA: Diagnosis not present

## 2023-03-12 DIAGNOSIS — O99334 Smoking (tobacco) complicating childbirth: Secondary | ICD-10-CM | POA: Diagnosis not present

## 2023-03-12 DIAGNOSIS — D62 Acute posthemorrhagic anemia: Secondary | ICD-10-CM | POA: Diagnosis not present

## 2023-03-12 DIAGNOSIS — Z3A36 36 weeks gestation of pregnancy: Secondary | ICD-10-CM | POA: Diagnosis not present

## 2023-03-12 DIAGNOSIS — O165 Unspecified maternal hypertension, complicating the puerperium: Secondary | ICD-10-CM | POA: Diagnosis not present

## 2023-03-12 DIAGNOSIS — F17210 Nicotine dependence, cigarettes, uncomplicated: Secondary | ICD-10-CM | POA: Diagnosis not present

## 2023-03-12 DIAGNOSIS — Z79899 Other long term (current) drug therapy: Secondary | ICD-10-CM | POA: Diagnosis not present

## 2023-03-12 DIAGNOSIS — O34212 Maternal care for vertical scar from previous cesarean delivery: Secondary | ICD-10-CM | POA: Diagnosis not present

## 2023-03-12 DIAGNOSIS — O99824 Streptococcus B carrier state complicating childbirth: Secondary | ICD-10-CM | POA: Diagnosis not present

## 2023-06-10 IMAGING — DX DG KNEE COMPLETE 4+V*R*
4 series · 4 of 4 positions shown · non-contrast
Comparison: None.

CLINICAL DATA: Fall.  Cannot bend.

EXAM:
RIGHT KNEE - COMPLETE 4+ VIEW

[knee ap]
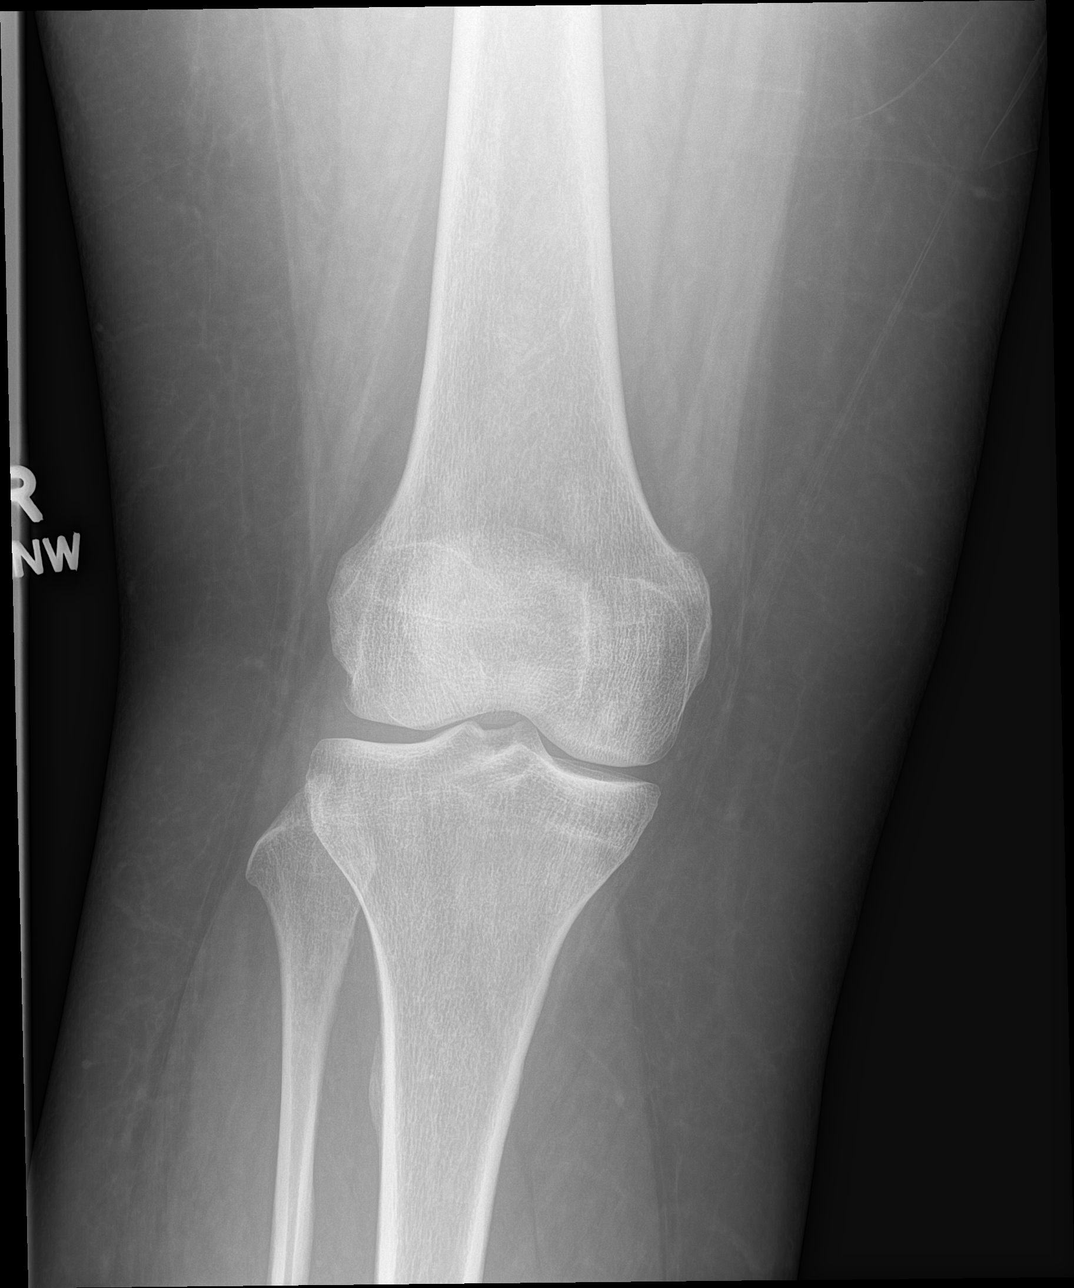

[knee obl (1 of 2)]
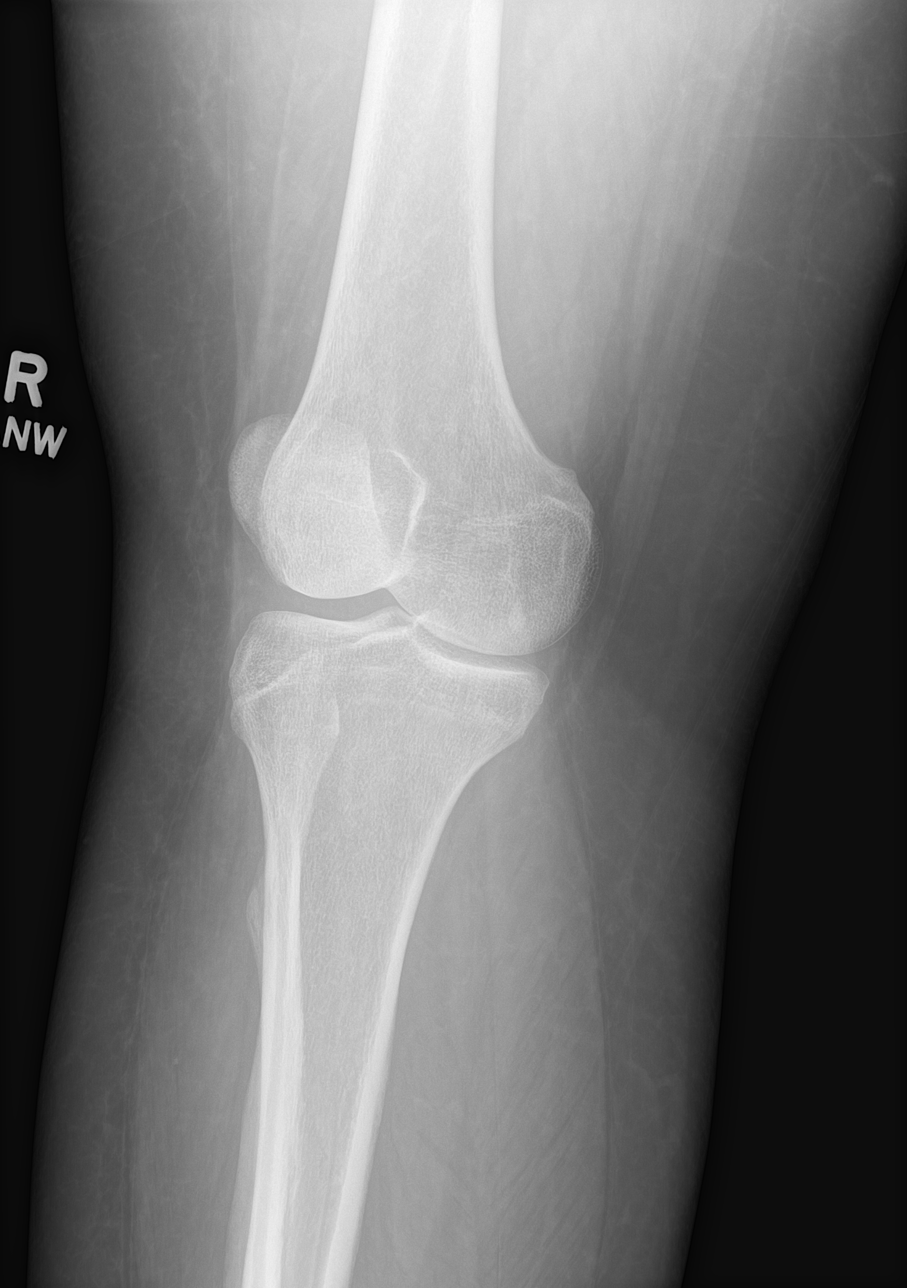

[knee obl (2 of 2)]
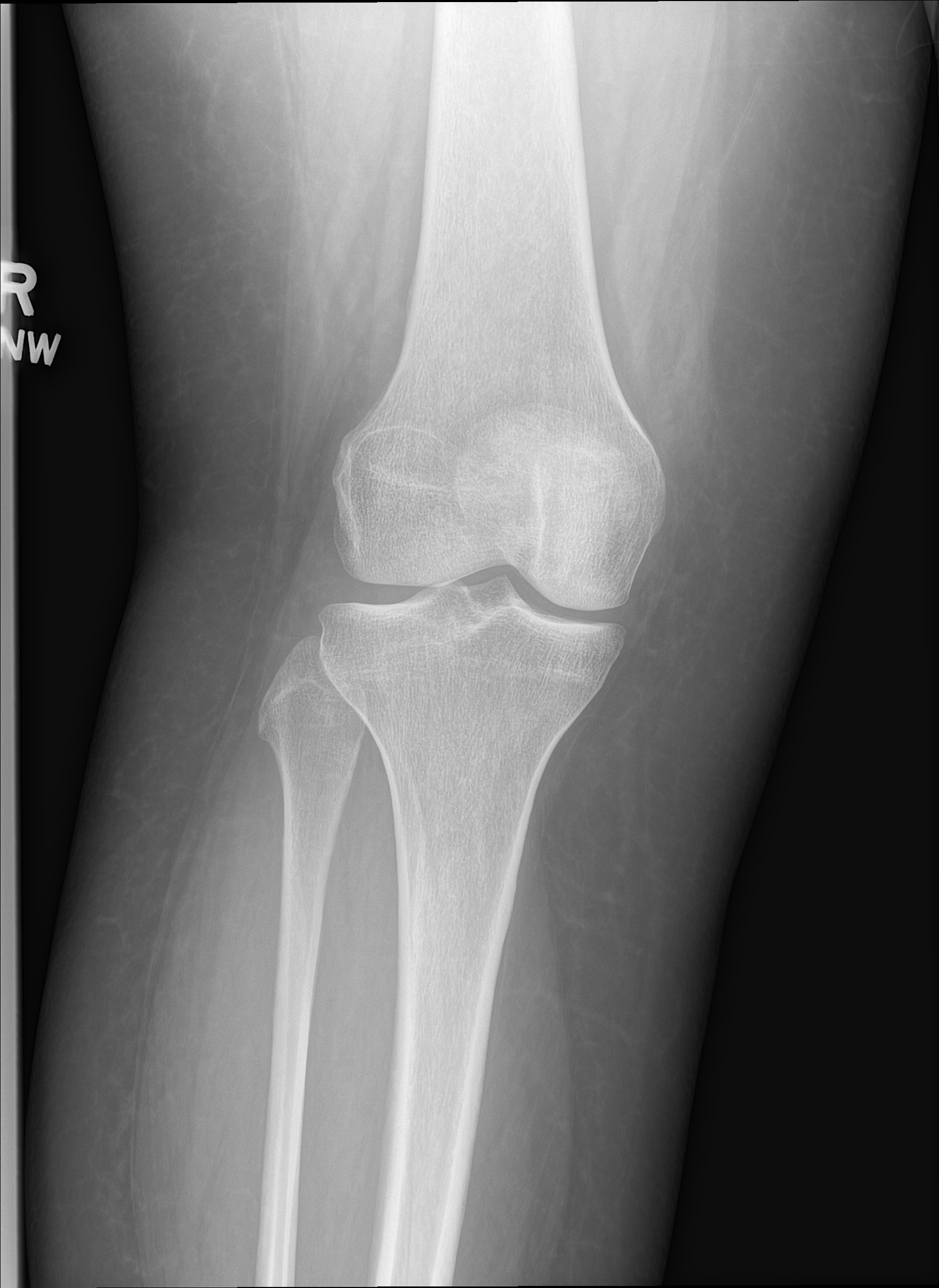

[knee lat]
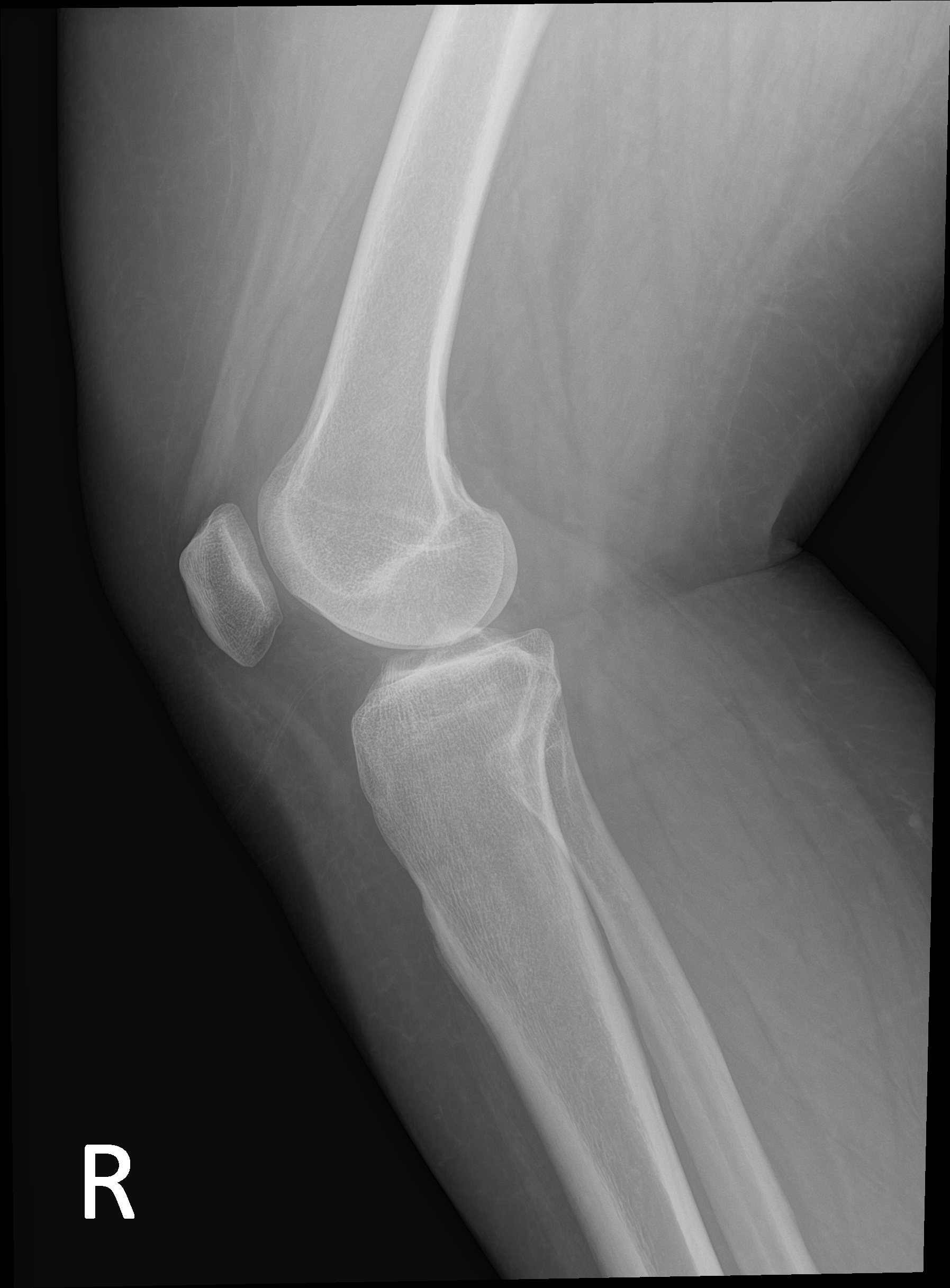

[4 of 4 positions shown; findings below may reference images not displayed]

FINDINGS: Normal bone mineralization. Joint spaces are preserved. No joint
effusion. No acute fracture or dislocation.
IMPRESSION: Normal right knee radiographs.

## 2023-12-13 DIAGNOSIS — I2542 Coronary artery dissection: Secondary | ICD-10-CM | POA: Diagnosis not present

## 2023-12-13 DIAGNOSIS — Z6841 Body Mass Index (BMI) 40.0 and over, adult: Secondary | ICD-10-CM | POA: Diagnosis not present

## 2023-12-13 DIAGNOSIS — I214 Non-ST elevation (NSTEMI) myocardial infarction: Secondary | ICD-10-CM | POA: Diagnosis not present

## 2023-12-13 DIAGNOSIS — I493 Ventricular premature depolarization: Secondary | ICD-10-CM | POA: Diagnosis not present

## 2023-12-13 DIAGNOSIS — I4729 Other ventricular tachycardia: Secondary | ICD-10-CM | POA: Diagnosis not present

## 2023-12-14 DIAGNOSIS — I200 Unstable angina: Secondary | ICD-10-CM | POA: Diagnosis not present

## 2023-12-14 DIAGNOSIS — I214 Non-ST elevation (NSTEMI) myocardial infarction: Secondary | ICD-10-CM | POA: Diagnosis not present

## 2023-12-14 DIAGNOSIS — I4729 Other ventricular tachycardia: Secondary | ICD-10-CM | POA: Diagnosis not present

## 2023-12-14 DIAGNOSIS — Z6841 Body Mass Index (BMI) 40.0 and over, adult: Secondary | ICD-10-CM | POA: Diagnosis not present

## 2023-12-14 DIAGNOSIS — I2542 Coronary artery dissection: Secondary | ICD-10-CM | POA: Diagnosis not present

## 2023-12-15 DIAGNOSIS — Z6841 Body Mass Index (BMI) 40.0 and over, adult: Secondary | ICD-10-CM | POA: Diagnosis not present

## 2023-12-15 DIAGNOSIS — I2542 Coronary artery dissection: Secondary | ICD-10-CM | POA: Diagnosis not present

## 2023-12-15 DIAGNOSIS — I4729 Other ventricular tachycardia: Secondary | ICD-10-CM | POA: Diagnosis not present

## 2023-12-28 DIAGNOSIS — I10 Essential (primary) hypertension: Secondary | ICD-10-CM | POA: Diagnosis not present

## 2023-12-28 DIAGNOSIS — I2542 Coronary artery dissection: Secondary | ICD-10-CM | POA: Diagnosis not present

## 2023-12-28 DIAGNOSIS — I340 Nonrheumatic mitral (valve) insufficiency: Secondary | ICD-10-CM | POA: Diagnosis not present

## 2023-12-28 DIAGNOSIS — I4729 Other ventricular tachycardia: Secondary | ICD-10-CM | POA: Diagnosis not present

## 2024-01-24 DIAGNOSIS — R112 Nausea with vomiting, unspecified: Secondary | ICD-10-CM | POA: Diagnosis not present

## 2024-01-24 DIAGNOSIS — R079 Chest pain, unspecified: Secondary | ICD-10-CM | POA: Diagnosis not present

## 2024-01-24 DIAGNOSIS — R519 Headache, unspecified: Secondary | ICD-10-CM | POA: Diagnosis not present

## 2024-01-24 DIAGNOSIS — I2542 Coronary artery dissection: Secondary | ICD-10-CM | POA: Diagnosis not present

## 2024-02-08 DIAGNOSIS — I639 Cerebral infarction, unspecified: Secondary | ICD-10-CM | POA: Diagnosis not present

## 2024-02-08 DIAGNOSIS — I340 Nonrheumatic mitral (valve) insufficiency: Secondary | ICD-10-CM | POA: Diagnosis not present

## 2024-02-08 DIAGNOSIS — I4729 Other ventricular tachycardia: Secondary | ICD-10-CM | POA: Diagnosis not present

## 2024-02-08 DIAGNOSIS — I2542 Coronary artery dissection: Secondary | ICD-10-CM | POA: Diagnosis not present

## 2024-02-08 DIAGNOSIS — I10 Essential (primary) hypertension: Secondary | ICD-10-CM | POA: Diagnosis not present

## 2024-02-23 DIAGNOSIS — R079 Chest pain, unspecified: Secondary | ICD-10-CM | POA: Diagnosis not present

## 2024-02-23 DIAGNOSIS — I2542 Coronary artery dissection: Secondary | ICD-10-CM | POA: Diagnosis not present

## 2024-02-23 DIAGNOSIS — I4729 Other ventricular tachycardia: Secondary | ICD-10-CM | POA: Diagnosis not present

## 2024-02-23 DIAGNOSIS — I639 Cerebral infarction, unspecified: Secondary | ICD-10-CM | POA: Diagnosis not present

## 2024-02-23 DIAGNOSIS — I10 Essential (primary) hypertension: Secondary | ICD-10-CM | POA: Diagnosis not present

## 2024-02-23 DIAGNOSIS — I340 Nonrheumatic mitral (valve) insufficiency: Secondary | ICD-10-CM | POA: Diagnosis not present

## 2024-03-13 DIAGNOSIS — I340 Nonrheumatic mitral (valve) insufficiency: Secondary | ICD-10-CM | POA: Diagnosis not present

## 2024-03-13 DIAGNOSIS — I2542 Coronary artery dissection: Secondary | ICD-10-CM | POA: Diagnosis not present

## 2024-03-13 DIAGNOSIS — I639 Cerebral infarction, unspecified: Secondary | ICD-10-CM | POA: Diagnosis not present

## 2024-03-29 ENCOUNTER — Inpatient Hospital Stay

## 2024-03-29 ENCOUNTER — Inpatient Hospital Stay: Attending: Hematology and Oncology | Admitting: Hematology and Oncology

## 2024-03-29 ENCOUNTER — Telehealth: Payer: Self-pay | Admitting: Hematology and Oncology

## 2024-03-29 ENCOUNTER — Encounter: Payer: Self-pay | Admitting: Hematology and Oncology

## 2024-03-29 ENCOUNTER — Other Ambulatory Visit: Payer: Self-pay

## 2024-03-29 VITALS — BP 129/75 | HR 88 | Temp 98.3°F | Resp 18 | Ht 64.0 in | Wt 248.5 lb

## 2024-03-29 DIAGNOSIS — D649 Anemia, unspecified: Secondary | ICD-10-CM

## 2024-03-29 LAB — CBC WITH DIFFERENTIAL (CANCER CENTER ONLY)
Abs Immature Granulocytes: 0.02 K/uL (ref 0.00–0.07)
Basophils Absolute: 0.1 K/uL (ref 0.0–0.1)
Basophils Relative: 1 %
Eosinophils Absolute: 0.1 K/uL (ref 0.0–0.5)
Eosinophils Relative: 1 %
HCT: 36.4 % (ref 36.0–46.0)
Hemoglobin: 11.4 g/dL — ABNORMAL LOW (ref 12.0–15.0)
Immature Granulocytes: 0 %
Lymphocytes Relative: 23 %
Lymphs Abs: 1.7 K/uL (ref 0.7–4.0)
MCH: 27 pg (ref 26.0–34.0)
MCHC: 31.3 g/dL (ref 30.0–36.0)
MCV: 86.3 fL (ref 80.0–100.0)
Monocytes Absolute: 0.6 K/uL (ref 0.1–1.0)
Monocytes Relative: 8 %
Neutro Abs: 4.8 K/uL (ref 1.7–7.7)
Neutrophils Relative %: 67 %
Platelet Count: 235 K/uL (ref 150–400)
RBC: 4.22 MIL/uL (ref 3.87–5.11)
RDW: 15.7 % — ABNORMAL HIGH (ref 11.5–15.5)
WBC Count: 7.2 K/uL (ref 4.0–10.5)
nRBC: 0 % (ref 0.0–0.2)

## 2024-03-29 LAB — CMP (CANCER CENTER ONLY)
ALT: 11 U/L (ref 0–44)
AST: 15 U/L (ref 15–41)
Albumin: 4.1 g/dL (ref 3.5–5.0)
Alkaline Phosphatase: 112 U/L (ref 38–126)
Anion gap: 9 (ref 5–15)
BUN: 8 mg/dL (ref 6–20)
CO2: 22 mmol/L (ref 22–32)
Calcium: 9.1 mg/dL (ref 8.9–10.3)
Chloride: 107 mmol/L (ref 98–111)
Creatinine: 0.79 mg/dL (ref 0.44–1.00)
GFR, Estimated: >60 mL/min
Glucose, Bld: 87 mg/dL (ref 70–99)
Potassium: 4.2 mmol/L (ref 3.5–5.1)
Sodium: 138 mmol/L (ref 135–145)
Total Bilirubin: 0.4 mg/dL (ref 0.0–1.2)
Total Protein: 6.7 g/dL (ref 6.5–8.1)

## 2024-03-29 LAB — VITAMIN B12: Vitamin B-12: <150 pg/mL — ABNORMAL LOW (ref 180–914)

## 2024-03-29 LAB — IRON AND TIBC
Iron: 39 ug/dL (ref 28–170)
Saturation Ratios: 9 % — ABNORMAL LOW (ref 10.4–31.8)
TIBC: 451 ug/dL — ABNORMAL HIGH (ref 250–450)
UIBC: 412 ug/dL

## 2024-03-29 LAB — FOLATE: Folate: <3 ng/mL — ABNORMAL LOW

## 2024-03-29 LAB — FERRITIN: Ferritin: 10 ng/mL — ABNORMAL LOW (ref 11–307)

## 2024-03-29 LAB — TSH: TSH: 1.34 u[IU]/mL (ref 0.350–4.500)

## 2024-03-29 NOTE — Progress Notes (Signed)
 " [facility] [address] [place],  [place]  [address] [phone]   Addendum: Iron  studies reveal decreased saturation, ferritin, folate and vitamin B12. We will begin IV Venofer  with B12 injections weekly x 4 and then monthly. We will also have her take folic acid daily.       Clinic Day:  03/29/2024   Referring physician: Trinidad Hun, MD  Patient Care Team: Patient Care Team: Patient, No Pcp Per as PCP - General (General Practice)   REASON FOR CONSULTATION:  Anemia  HISTORY OF PRESENT ILLNESS:   Alice Grant is a 34 y.o. female with a history of anemia who is referred in consultation by Hun Trinidad, MD for assessment and management. She has recent complaints of chest pain, shortness of breath and headache. She has a history of spontaneous dissection of coronary artery and is being managed by cardiology. Recent hypercoag workup was negative. Medical history includes leaky heart valve, spontaneous dissection of coronary artery, mitral valve prolapse, CVA, hypertension, mitral regurgitation, NSVT and tachycardia. Surgical history includes Cesarean section and D&C. Family history is significant for cardiomyopathy, myocardial infarction, heart failure, hyperlipidemia, hypertension and stroke. She is a current every day smoker, smoking less than 1 PPD. She denies alcohol or drug use. She is married and lives with her spouse.    REVIEW OF SYSTEMS:   Review of Systems  Constitutional:  Positive for appetite change, fatigue and unexpected weight change.  HENT:  Negative.    Eyes: Negative.   Respiratory: Negative.    Cardiovascular:  Positive for chest pain.  Gastrointestinal:  Positive for diarrhea and nausea.  Endocrine: Negative.   Genitourinary:  Positive for vaginal bleeding.   Skin: Negative.   Neurological:  Positive for extremity weakness, headaches, light-headedness and numbness.  Psychiatric/Behavioral: Negative.       VITALS:   Blood  pressure 129/75, pulse 88, temperature 98.3 F (36.8 C), temperature source Oral, resp. rate 18, height 5' 4 (1.626 m), weight 248 lb 8 oz (112.7 kg), SpO2 100%, unknown if currently breastfeeding.  Wt Readings from Last 3 Encounters:  03/29/24 248 lb 8 oz (112.7 kg)  05/09/21 250 lb (113.4 kg)  11/18/16 228 lb (103.4 kg)    Body mass index is 42.65 kg/m.  Performance status (ECOG): 1 - Symptomatic but completely ambulatory  PHYSICAL EXAM:   Physical Exam Constitutional:      Appearance: Normal appearance. She is normal weight.  HENT:     Head: Normocephalic and atraumatic.     Mouth/Throat:     Mouth: Mucous membranes are moist.  Cardiovascular:     Rate and Rhythm: Normal rate and regular rhythm.     Pulses: Normal pulses.     Heart sounds: Normal heart sounds.  Pulmonary:     Effort: Pulmonary effort is normal.  Abdominal:     General: Abdomen is flat. Bowel sounds are normal.     Palpations: Abdomen is soft.  Musculoskeletal:        General: Normal range of motion.  Skin:    General: Skin is warm and dry.  Neurological:     General: No focal deficit present.     Mental Status: She is alert and oriented to person, place, and time. Mental status is at baseline.  Psychiatric:        Mood and Affect: Mood normal.        Behavior: Behavior normal.        Thought Content: Thought content normal.  Judgment: Judgment normal.     LABS:      Latest Ref Rng & Units 10/07/2015    9:50 AM 04/04/2015    3:27 PM  CBC  WBC 4.0 - 10.5 K/uL 6.8  7.8   Hemoglobin 12.0 - 15.0 g/dL 87.9  87.1   Hematocrit 36.0 - 46.0 % 37.6  38.8   Platelets 150 - 400 K/uL 276  311       Latest Ref Rng & Units 10/07/2015    9:50 AM 04/04/2015    3:27 PM  CMP  Glucose 65 - 99 mg/dL 87  98   BUN 6 - 20 mg/dL 8  8   Creatinine 9.55 - 1.00 mg/dL 9.38  9.41   Sodium 864 - 145 mmol/L 137  141   Potassium 3.5 - 5.1 mmol/L 4.0  3.8   Chloride 101 - 111 mmol/L 109  110   CO2 22 - 32  mmol/L 23  21   Calcium 8.9 - 10.3 mg/dL 8.7  9.0      No results found for: CEA1, CEA / No results found for: CEA1, CEA No results found for: PSA1 No results found for: CAN199 No results found for: CAN125  No results found for: TOTALPROTELP, ALBUMINELP, A1GS, A2GS, BETS, BETA2SER, GAMS, MSPIKE, SPEI No results found for: TIBC, FERRITIN, IRONPCTSAT No results found for: LDH  STUDIES:   No results found.    HISTORY:   Past Medical History:  Diagnosis Date   Anxiety    panic attacks as teen   Complication of anesthesia    epid went up not down   Degenerative disc disease, lumbar    Depression    as teen   Infection    urinary tract infection   Myocardial infarction (HCC)    Pregnancy induced hypertension    Tachycardia    Vertigo     Past Surgical History:  Procedure Laterality Date   bowel rotation     abd organs were reversed   CESAREAN SECTION     x3   DILATION AND CURETTAGE OF UTERUS     LEFT HEART CATH     MYRINGOTOMY     TEE WITHOUT CARDIOVERSION      Family History  Problem Relation Age of Onset   COPD Mother    Diabetes Mother    Heart disease Mother    Hyperlipidemia Mother    Hypertension Mother    Lupus Mother    Lung cancer Mother    Breast cancer Mother    Heart disease Maternal Grandmother    Heart disease Maternal Grandfather    Other Neg Hx     Social History:  reports that she has been smoking cigarettes. She has never used smokeless tobacco. She reports that she does not currently use alcohol. She reports that she does not use drugs.The patient is alone  today.  Allergies: Allergies[1]  Current Medications: Current Outpatient Medications  Medication Sig Dispense Refill   amLODipine (NORVASC) 5 MG tablet Take 5 mg by mouth daily.     aspirin EC 81 MG tablet Take 81 mg by mouth daily.     atorvastatin (LIPITOR) 10 MG tablet Take 10 mg by mouth daily.     losartan (COZAAR) 50 MG tablet Take  50 mg by mouth daily.     metoprolol  succinate (TOPROL -XL) 50 MG 24 hr tablet Take 50 mg by mouth daily.     nitroGLYCERIN (NITROSTAT) 0.4 MG SL tablet Place 0.4 mg under  the tongue every 5 (five) minutes as needed for chest pain.     ranolazine (RANEXA) 1000 MG SR tablet Take 1,000 mg by mouth 2 (two) times daily.     No current facility-administered medications for this visit.     ASSESSMENT & PLAN:   Assessment:  Alice Grant is a 34 y.o. female with worsening symptoms of possible iron  deficient anemia. She notes shortness of breath upon exertion, fatigue, and headache. Hemoglobin today is 12.0. We discussed possible treatment options should she be iron  deficient. As she has noted nausea and vomiting, we will try to avoid oral iron  if possible. We discussed iron  infusions and possible side effects including infusion reactions. If appropriate, she is agreeable to IV iron .   Plan: 1.  If appropriate, IV Venofer  with follow up 4 weeks after last infusion.   I discussed the assessment and treatment plan with the patient.  The patient was provided an opportunity to ask questions and all were answered.  The patient agreed with the plan and demonstrated an understanding of the instructions.    Thank you for the referral    45 minutes was spent in patient care.  This included time spent preparing to see the patient (e.g., review of tests), obtaining and/or reviewing separately obtained history, counseling and educating the patient/family/caregiver, ordering medications, tests, or procedures; documenting clinical information in the electronic or other health record, independently interpreting results and communicating results to the patient/family/caregiver as well as coordination of care.      Eleanor DELENA Bach, NP   Physician Assistant [facility] [place] [phone]        [1] No Known Allergies  "

## 2024-03-29 NOTE — Telephone Encounter (Signed)
 Patient has been scheduled for follow-up visit per 03/28/2024 LOS.  Pt given an appt calendar with date and time.

## 2024-03-30 ENCOUNTER — Other Ambulatory Visit: Payer: Self-pay | Admitting: Hematology and Oncology

## 2024-03-30 DIAGNOSIS — D508 Other iron deficiency anemias: Secondary | ICD-10-CM

## 2024-03-30 DIAGNOSIS — D509 Iron deficiency anemia, unspecified: Secondary | ICD-10-CM | POA: Insufficient documentation

## 2024-03-30 MED ORDER — CYANOCOBALAMIN 1000 MCG/ML IJ SOLN: 1000.0000 ug | INTRAMUSCULAR | 0 refills | Status: AC

## 2024-04-03 ENCOUNTER — Telehealth: Payer: Self-pay | Admitting: Hematology and Oncology

## 2024-04-03 ENCOUNTER — Other Ambulatory Visit: Payer: Self-pay | Admitting: Hematology and Oncology

## 2024-04-03 NOTE — Telephone Encounter (Signed)
 04/03/24 LVM to schedule Venofer  appts.

## 2024-04-04 ENCOUNTER — Inpatient Hospital Stay

## 2024-04-04 VITALS — BP 109/64 | HR 92 | Temp 98.1°F | Resp 18

## 2024-04-04 DIAGNOSIS — D508 Other iron deficiency anemias: Secondary | ICD-10-CM

## 2024-04-04 MED ORDER — SODIUM CHLORIDE 0.9 % IV SOLN: INTRAVENOUS | Status: DC

## 2024-04-04 MED ORDER — LORATADINE 10 MG PO TABS
10.0000 mg | ORAL_TABLET | Freq: Once | ORAL | Status: AC
  Administered 2024-04-04: 10 mg via ORAL
  Filled 2024-04-04: qty 1

## 2024-04-04 MED ORDER — ACETAMINOPHEN 325 MG PO TABS
650.0000 mg | ORAL_TABLET | Freq: Once | ORAL | Status: AC
  Administered 2024-04-04: 650 mg via ORAL
  Filled 2024-04-04: qty 2

## 2024-04-04 MED ORDER — IRON SUCROSE 20 MG/ML IV SOLN
200.0000 mg | Freq: Once | INTRAVENOUS | Status: AC
  Administered 2024-04-04: 200 mg via INTRAVENOUS
  Filled 2024-04-04: qty 10

## 2024-04-04 NOTE — Patient Instructions (Signed)

## 2024-04-05 ENCOUNTER — Inpatient Hospital Stay

## 2024-04-05 ENCOUNTER — Encounter: Payer: Self-pay | Admitting: Hematology and Oncology

## 2024-04-05 ENCOUNTER — Other Ambulatory Visit: Payer: Self-pay | Admitting: Pharmacist

## 2024-04-05 VITALS — BP 143/104 | HR 83 | Temp 98.2°F | Resp 16

## 2024-04-05 DIAGNOSIS — D508 Other iron deficiency anemias: Secondary | ICD-10-CM

## 2024-04-05 MED ORDER — ACETAMINOPHEN 325 MG PO TABS: 650.0000 mg | ORAL_TABLET | Freq: Once | ORAL | Status: DC

## 2024-04-05 MED ORDER — IRON SUCROSE 20 MG/ML IV SOLN
200.0000 mg | Freq: Once | INTRAVENOUS | Status: AC
  Administered 2024-04-05: 200 mg via INTRAVENOUS
  Filled 2024-04-05: qty 10

## 2024-04-05 MED ORDER — LORATADINE 10 MG PO TABS: 10.0000 mg | ORAL_TABLET | Freq: Once | ORAL | Status: DC

## 2024-04-05 NOTE — Patient Instructions (Signed)

## 2024-04-07 ENCOUNTER — Inpatient Hospital Stay

## 2024-04-07 VITALS — BP 142/91 | HR 88 | Temp 98.2°F | Resp 16

## 2024-04-07 DIAGNOSIS — D508 Other iron deficiency anemias: Secondary | ICD-10-CM

## 2024-04-07 LAB — JAK2 V617F RFX CALR/MPL/E12-15

## 2024-04-07 LAB — CALR +MPL + E12-E15  (REFLEX)

## 2024-04-07 MED ORDER — IRON SUCROSE 20 MG/ML IV SOLN
200.0000 mg | Freq: Once | INTRAVENOUS | Status: AC
  Administered 2024-04-07: 200 mg via INTRAVENOUS
  Filled 2024-04-07: qty 10

## 2024-04-07 MED ORDER — ACETAMINOPHEN 325 MG PO TABS: 650.0000 mg | ORAL_TABLET | Freq: Once | ORAL | Status: DC

## 2024-04-07 MED ORDER — LORATADINE 10 MG PO TABS: 10.0000 mg | ORAL_TABLET | Freq: Once | ORAL | Status: DC

## 2024-04-07 NOTE — Patient Instructions (Signed)

## 2024-04-10 ENCOUNTER — Inpatient Hospital Stay

## 2024-04-10 VITALS — BP 133/83 | HR 91 | Temp 98.1°F | Resp 18

## 2024-04-10 DIAGNOSIS — D508 Other iron deficiency anemias: Secondary | ICD-10-CM

## 2024-04-10 MED ORDER — SODIUM CHLORIDE 0.9 % IV SOLN: INTRAVENOUS | Status: DC

## 2024-04-10 MED ORDER — IRON SUCROSE 20 MG/ML IV SOLN
200.0000 mg | Freq: Once | INTRAVENOUS | Status: AC
  Administered 2024-04-10: 200 mg via INTRAVENOUS
  Filled 2024-04-10: qty 10

## 2024-04-10 MED ORDER — LORATADINE 10 MG PO TABS: 10.0000 mg | ORAL_TABLET | Freq: Once | ORAL | Status: DC

## 2024-04-10 MED ORDER — ACETAMINOPHEN 325 MG PO TABS: 650.0000 mg | ORAL_TABLET | Freq: Once | ORAL | Status: DC

## 2024-04-10 NOTE — Progress Notes (Signed)
 Pt declined 30 min wait time, d/c stable

## 2024-04-10 NOTE — Patient Instructions (Signed)

## 2024-04-12 ENCOUNTER — Inpatient Hospital Stay

## 2024-04-12 ENCOUNTER — Inpatient Hospital Stay: Admitting: Hematology and Oncology

## 2024-04-12 VITALS — BP 130/84 | HR 84 | Temp 98.0°F | Resp 18

## 2024-04-12 DIAGNOSIS — D508 Other iron deficiency anemias: Secondary | ICD-10-CM

## 2024-04-12 MED ORDER — IRON SUCROSE 20 MG/ML IV SOLN
200.0000 mg | Freq: Once | INTRAVENOUS | Status: AC
  Administered 2024-04-12: 200 mg via INTRAVENOUS
  Filled 2024-04-12: qty 10

## 2024-04-12 MED ORDER — ACETAMINOPHEN 325 MG PO TABS: 650.0000 mg | ORAL_TABLET | Freq: Once | ORAL | Status: DC

## 2024-04-12 MED ORDER — LORATADINE 10 MG PO TABS: 10.0000 mg | ORAL_TABLET | Freq: Once | ORAL | Status: DC

## 2024-04-26 ENCOUNTER — Encounter: Payer: Self-pay | Admitting: Hematology and Oncology

## 2024-04-27 ENCOUNTER — Telehealth: Payer: Self-pay

## 2024-04-27 NOTE — Telephone Encounter (Signed)
 Pt called & requested that her cardiologist wanted the office note from 03/29/2024 faxed to them.

## 2024-05-10 ENCOUNTER — Inpatient Hospital Stay

## 2024-05-10 ENCOUNTER — Inpatient Hospital Stay: Admitting: Hematology and Oncology

## 2024-05-17 ENCOUNTER — Inpatient Hospital Stay: Admitting: Genetic Counselor

## 2024-05-17 ENCOUNTER — Inpatient Hospital Stay

## 2024-06-08 ENCOUNTER — Ambulatory Visit: Admitting: Medical Genetics
